# Patient Record
Sex: Female | Born: 1960 | Race: Black or African American | Hispanic: No | Marital: Single | Smoking: Former smoker
Health system: Southern US, Community
[De-identification: ages and names within clinical notes are randomized; demographics above are authoritative.]

## PROBLEM LIST (undated history)

## (undated) DIAGNOSIS — E0590 Thyrotoxicosis, unspecified without thyrotoxic crisis or storm: Secondary | ICD-10-CM

## (undated) DIAGNOSIS — E7800 Pure hypercholesterolemia, unspecified: Secondary | ICD-10-CM

## (undated) DIAGNOSIS — J301 Allergic rhinitis due to pollen: Secondary | ICD-10-CM

## (undated) DIAGNOSIS — M1712 Unilateral primary osteoarthritis, left knee: Secondary | ICD-10-CM

## (undated) DIAGNOSIS — I10 Essential (primary) hypertension: Secondary | ICD-10-CM

## (undated) DIAGNOSIS — E119 Type 2 diabetes mellitus without complications: Secondary | ICD-10-CM

## (undated) DIAGNOSIS — G5600 Carpal tunnel syndrome, unspecified upper limb: Secondary | ICD-10-CM

## (undated) DIAGNOSIS — Z972 Presence of dental prosthetic device (complete) (partial): Secondary | ICD-10-CM

## (undated) HISTORY — PX: HALLUX VALGUS CORRECTION: SUR315

## (undated) HISTORY — PX: TONSILLECTOMY: SUR1361

## (undated) HISTORY — PX: ABDOMINAL HYSTERECTOMY: SHX81

---

## 2004-07-05 ENCOUNTER — Other Ambulatory Visit: Payer: Self-pay

## 2004-07-05 ENCOUNTER — Emergency Department: Payer: Self-pay | Admitting: Unknown Physician Specialty

## 2013-10-25 ENCOUNTER — Ambulatory Visit: Payer: Self-pay | Admitting: Family Medicine

## 2013-11-18 ENCOUNTER — Ambulatory Visit: Payer: Self-pay | Admitting: Gastroenterology

## 2013-11-18 HISTORY — PX: COLONOSCOPY: SHX174

## 2013-12-15 HISTORY — PX: BUNIONECTOMY: SHX129

## 2013-12-16 ENCOUNTER — Ambulatory Visit: Payer: Self-pay | Admitting: Podiatry

## 2014-09-28 ENCOUNTER — Other Ambulatory Visit: Payer: Self-pay | Admitting: Family Medicine

## 2014-09-28 DIAGNOSIS — Z1231 Encounter for screening mammogram for malignant neoplasm of breast: Secondary | ICD-10-CM

## 2014-10-27 ENCOUNTER — Ambulatory Visit
Admission: RE | Admit: 2014-10-27 | Discharge: 2014-10-27 | Disposition: A | Discharge status: Home / Self Care | Payer: BLUE CROSS/BLUE SHIELD | Source: Ambulatory Visit | Attending: Family Medicine | Admitting: Family Medicine

## 2014-10-27 DIAGNOSIS — Z1231 Encounter for screening mammogram for malignant neoplasm of breast: Secondary | ICD-10-CM | POA: Insufficient documentation

## 2014-11-11 ENCOUNTER — Encounter: Payer: Self-pay | Admitting: *Deleted

## 2014-11-17 ENCOUNTER — Ambulatory Visit: Payer: BLUE CROSS/BLUE SHIELD | Admitting: Anesthesiology

## 2014-11-17 ENCOUNTER — Ambulatory Visit
Admission: RE | Admit: 2014-11-17 | Discharge: 2014-11-17 | Disposition: A | Discharge status: Home / Self Care | Payer: BLUE CROSS/BLUE SHIELD | Source: Ambulatory Visit | Attending: Podiatry | Admitting: Podiatry

## 2014-11-17 ENCOUNTER — Encounter
Admission: RE | Disposition: A | Discharge status: Home / Self Care | Payer: Self-pay | Source: Ambulatory Visit | Attending: Podiatry

## 2014-11-17 DIAGNOSIS — E669 Obesity, unspecified: Secondary | ICD-10-CM | POA: Diagnosis not present

## 2014-11-17 DIAGNOSIS — Q6621 Congenital metatarsus primus varus: Secondary | ICD-10-CM | POA: Diagnosis not present

## 2014-11-17 DIAGNOSIS — M2012 Hallux valgus (acquired), left foot: Secondary | ICD-10-CM | POA: Diagnosis not present

## 2014-11-17 DIAGNOSIS — Z79899 Other long term (current) drug therapy: Secondary | ICD-10-CM | POA: Diagnosis not present

## 2014-11-17 DIAGNOSIS — E7800 Pure hypercholesterolemia, unspecified: Secondary | ICD-10-CM | POA: Diagnosis not present

## 2014-11-17 DIAGNOSIS — E119 Type 2 diabetes mellitus without complications: Secondary | ICD-10-CM | POA: Diagnosis not present

## 2014-11-17 DIAGNOSIS — Z7982 Long term (current) use of aspirin: Secondary | ICD-10-CM | POA: Diagnosis not present

## 2014-11-17 DIAGNOSIS — Z888 Allergy status to other drugs, medicaments and biological substances status: Secondary | ICD-10-CM | POA: Diagnosis not present

## 2014-11-17 DIAGNOSIS — F17210 Nicotine dependence, cigarettes, uncomplicated: Secondary | ICD-10-CM | POA: Insufficient documentation

## 2014-11-17 DIAGNOSIS — I10 Essential (primary) hypertension: Secondary | ICD-10-CM | POA: Insufficient documentation

## 2014-11-17 DIAGNOSIS — Z90710 Acquired absence of both cervix and uterus: Secondary | ICD-10-CM | POA: Diagnosis not present

## 2014-11-17 HISTORY — DX: Carpal tunnel syndrome, unspecified upper limb: G56.00

## 2014-11-17 HISTORY — PX: METATARSAL OSTEOTOMY: SHX1641

## 2014-11-17 HISTORY — DX: Pure hypercholesterolemia, unspecified: E78.00

## 2014-11-17 HISTORY — DX: Presence of dental prosthetic device (complete) (partial): Z97.2

## 2014-11-17 HISTORY — DX: Type 2 diabetes mellitus without complications: E11.9

## 2014-11-17 HISTORY — DX: Essential (primary) hypertension: I10

## 2014-11-17 LAB — GLUCOSE, CAPILLARY
Glucose-Capillary: 76 mg/dL (ref 65–99)
Glucose-Capillary: 86 mg/dL (ref 65–99)

## 2014-11-17 SURGERY — OSTEOTOMY, METATARSAL BONE
Anesthesia: General | Laterality: Left | Wound class: Clean

## 2014-11-17 MED ORDER — PROPOFOL 10 MG/ML IV BOLUS
INTRAVENOUS | Status: DC | PRN
  Administered 2014-11-17: 150 mg via INTRAVENOUS

## 2014-11-17 MED ORDER — BUPIVACAINE HCL (PF) 0.5 % IJ SOLN
INTRAMUSCULAR | Status: DC | PRN
  Administered 2014-11-17: 10 mL

## 2014-11-17 MED ORDER — OXYCODONE HCL 5 MG PO TABS: 5.0000 mg | ORAL_TABLET | Freq: Once | ORAL | Status: DC | PRN

## 2014-11-17 MED ORDER — HYDROMORPHONE HCL 1 MG/ML IJ SOLN: 0.2500 mg | INTRAMUSCULAR | Status: DC | PRN

## 2014-11-17 MED ORDER — OXYCODONE HCL 5 MG/5ML PO SOLN: 5.0000 mg | Freq: Once | ORAL | Status: DC | PRN

## 2014-11-17 MED ORDER — DEXAMETHASONE SODIUM PHOSPHATE 10 MG/ML IJ SOLN
INTRAMUSCULAR | Status: DC | PRN
  Administered 2014-11-17: 4 mg

## 2014-11-17 MED ORDER — CEFAZOLIN SODIUM-DEXTROSE 2-3 GM-% IV SOLR: 2.0000 g | Freq: Once | INTRAVENOUS | Status: DC

## 2014-11-17 MED ORDER — ROPIVACAINE HCL 5 MG/ML IJ SOLN
INTRAMUSCULAR | Status: DC | PRN
  Administered 2014-11-17: 200 mg via PERINEURAL

## 2014-11-17 MED ORDER — EPHEDRINE SULFATE 50 MG/ML IJ SOLN
INTRAMUSCULAR | Status: DC | PRN
  Administered 2014-11-17: 10 mg via INTRAVENOUS
  Administered 2014-11-17: 5 mg via INTRAVENOUS

## 2014-11-17 MED ORDER — GLYCOPYRROLATE 0.2 MG/ML IJ SOLN
INTRAMUSCULAR | Status: DC | PRN
  Administered 2014-11-17: 0.1 mg via INTRAVENOUS

## 2014-11-17 MED ORDER — LACTATED RINGERS IV SOLN
INTRAVENOUS | Status: DC
  Administered 2014-11-17: 07:00:00 via INTRAVENOUS

## 2014-11-17 MED ORDER — FENTANYL CITRATE (PF) 100 MCG/2ML IJ SOLN
INTRAMUSCULAR | Status: DC | PRN
  Administered 2014-11-17 (×2): 50 ug via INTRAVENOUS

## 2014-11-17 MED ORDER — PROMETHAZINE HCL 25 MG/ML IJ SOLN: 6.2500 mg | INTRAMUSCULAR | Status: DC | PRN

## 2014-11-17 MED ORDER — DEXAMETHASONE SODIUM PHOSPHATE 4 MG/ML IJ SOLN
INTRAMUSCULAR | Status: DC | PRN
  Administered 2014-11-17: 4 mg via INTRAVENOUS

## 2014-11-17 MED ORDER — MIDAZOLAM HCL 5 MG/5ML IJ SOLN
INTRAMUSCULAR | Status: DC | PRN
  Administered 2014-11-17: 2 mg via INTRAVENOUS

## 2014-11-17 MED ORDER — ONDANSETRON HCL 4 MG/2ML IJ SOLN
INTRAMUSCULAR | Status: DC | PRN
  Administered 2014-11-17: 4 mg via INTRAVENOUS

## 2014-11-17 MED ORDER — MEPERIDINE HCL 25 MG/ML IJ SOLN: 6.2500 mg | INTRAMUSCULAR | Status: DC | PRN

## 2014-11-17 MED ORDER — LIDOCAINE HCL (CARDIAC) 20 MG/ML IV SOLN
INTRAVENOUS | Status: DC | PRN
  Administered 2014-11-17: 50 mg via INTRATRACHEAL

## 2014-11-17 SURGICAL SUPPLY — 42 items
BANDAGE ELASTIC 4 CLIP NS LF (GAUZE/BANDAGES/DRESSINGS) ×3 IMPLANT
BENZOIN TINCTURE PRP APPL 2/3 (GAUZE/BANDAGES/DRESSINGS) ×3 IMPLANT
BLADE OSC/SAGITTAL MD 5.5X18 (BLADE) ×3 IMPLANT
BLADE OSC/SAGITTAL MD 9X18.5 (BLADE) ×3 IMPLANT
BNDG ESMARK 4X12 TAN STRL LF (GAUZE/BANDAGES/DRESSINGS) ×3 IMPLANT
BNDG GAUZE 4.5X4.1 6PLY STRL (MISCELLANEOUS) ×3 IMPLANT
BNDG STRETCH 4X75 STRL LF (GAUZE/BANDAGES/DRESSINGS) ×3 IMPLANT
CANISTER SUCT 1200ML W/VALVE (MISCELLANEOUS) ×3 IMPLANT
CAST PADDING 3X4FT ST 30246 (SOFTGOODS) ×4
CLOSURE WOUND 1/4X4 (GAUZE/BANDAGES/DRESSINGS) ×1
CUFF TOURN SGL QUICK 18 (TOURNIQUET CUFF) ×3 IMPLANT
DRAPE FLUOR MINI C-ARM 54X84 (DRAPES) ×3 IMPLANT
DURAPREP 26ML APPLICATOR (WOUND CARE) ×3 IMPLANT
GAUZE PETRO XEROFOAM 1X8 (MISCELLANEOUS) ×3 IMPLANT
GAUZE SPONGE 4X4 12PLY STRL (GAUZE/BANDAGES/DRESSINGS) ×3 IMPLANT
GLOVE BIO SURGEON STRL SZ7.5 (GLOVE) ×6 IMPLANT
GLOVE BIO SURGEON STRL SZ8 (GLOVE) ×3 IMPLANT
GLOVE INDICATOR 8.0 STRL GRN (GLOVE) ×6 IMPLANT
GOWN STRL REUS W/ TWL XL LVL3 (GOWN DISPOSABLE) ×2 IMPLANT
GOWN STRL REUS W/TWL XL LVL3 (GOWN DISPOSABLE) ×4
K-WIRE DBL END TROCAR 6X.045 (WIRE) ×3
K-WIRE DBL END TROCAR 6X.062 (WIRE) ×3
KWIRE DBL END TROCAR 6X.045 (WIRE) ×1 IMPLANT
KWIRE DBL END TROCAR 6X.062 (WIRE) ×1 IMPLANT
MICROAIRE 0.062 KWIRE ×3 IMPLANT
NEEDLE HYPO 18GX1.5 BLUNT FILL (NEEDLE) IMPLANT
NEEDLE HYPO 25GX1X1/2 BEV (NEEDLE) IMPLANT
PACK EXTREMITY ARMC (MISCELLANEOUS) ×3 IMPLANT
PAD CAST CTTN 3X4 STRL (SOFTGOODS) ×2 IMPLANT
PAD GROUND ADULT SPLIT (MISCELLANEOUS) ×3 IMPLANT
RASP SM TEAR CROSS CUT (RASP) ×6 IMPLANT
SPLINT CAST 1 STEP 4X30 (MISCELLANEOUS) ×3 IMPLANT
SPLINT FAST PLASTER 5X30 (CAST SUPPLIES) ×2
SPLINT PLASTER CAST FAST 5X30 (CAST SUPPLIES) ×1 IMPLANT
STAPLE SPR MET 9X9X9 1.5 (Staple) ×3 IMPLANT
STOCKINETTE STRL 6IN 960660 (GAUZE/BANDAGES/DRESSINGS) ×3 IMPLANT
STRIP CLOSURE SKIN 1/4X4 (GAUZE/BANDAGES/DRESSINGS) ×2 IMPLANT
SUT VIC AB 3-0 SH 27 (SUTURE) ×2
SUT VIC AB 3-0 SH 27X BRD (SUTURE) ×1 IMPLANT
SUT VIC AB 4-0 FS2 27 (SUTURE) ×3 IMPLANT
SYRINGE 10CC LL (SYRINGE) IMPLANT
WATER STERILE IRR 1000ML POUR (IV SOLUTION) ×3 IMPLANT

## 2014-11-17 NOTE — Discharge Instructions (Signed)
[facility]  POST OPERATIVE INSTRUCTIONS FOR DR. TROXLER AND DR[facility] PODIATRY DEPARTMENT   1. Take your medication as prescribed.  Pain medication should be taken only as needed.  2. Keep the dressing clean, dry and intact.  3. Keep your foot elevated above the heart level for the first 48 hours.  4. Walking to the bathroom and brief periods of walking are acceptable, unless we have instructed you to be non-weight bearing.  5. Always wear your post-op shoe when walking.  Always use your crutches if you are to be non-weight bearing.   6. Do not take a shower. Baths are permissible as long as the foot is kept out of the water.   7. Every hour you are awake:  - Bend your knee 15 times. - Flex foot 15 times - Massage calf 15 times  8. Call [facility] [phone]) if any of the following problems occur: - You develop a temperature or fever. - The bandage becomes saturated with blood. - Medication does not stop your pain. - Injury of the foot occurs. - Any symptoms of infection including redness, odor, or red streaks running from wound.  General Anesthesia, Adult, Care After Refer to this sheet in the next few weeks. These instructions provide you with information on caring for yourself after your procedure. Your health care provider may also give you more specific instructions. Your treatment has been planned according to current medical practices, but problems sometimes occur. Call your health care provider if you have any problems or questions after your procedure. WHAT TO EXPECT AFTER THE PROCEDURE After the procedure, it is typical to experience:  Sleepiness.  Nausea and vomiting. HOME CARE INSTRUCTIONS  For the first 24 hours after general anesthesia:  Have a responsible person with you.  Do not drive a car. If you are alone, do not take public transportation.  Do not drink alcohol.  Do not take  medicine that has not been prescribed by your health care provider.  Do not sign important papers or make important decisions.  You may resume a normal diet and activities as directed by your health care provider.  Change bandages (dressings) as directed.  If you have questions or problems that seem related to general anesthesia, call the hospital and ask for the anesthetist or anesthesiologist on call. SEEK MEDICAL CARE IF:  You have nausea and vomiting that continue the day after anesthesia.  You develop a rash. SEEK IMMEDIATE MEDICAL CARE IF:   You have difficulty breathing.  You have chest pain.  You have any allergic problems.   This information is not intended to replace advice given to you by your health care provider. Make sure you discuss any questions you have with your health care provider.   Document Released: 04/08/2000 Document Revised: 01/21/2014 Document Reviewed: 05/01/2011 Elsevier Interactive Patient Education 2016 Else[employer].  Peripheral Nerve Blocks Your caregiver has placed a nerve block in one of your arms or legs to reduce pain and discomfort. The block lessens the amount of pain medicine you will need. Your caregiver will inject you in the arm or leg that was operated on. The injection is usually given away from the surgical site. The injection is a local anesthetic or a combination of local anesthetics. This injection provides numbing pain relief for up to 18 to 24 hours. There are few possible complications from this procedure. However, you should notify your caregiver if you have any problems.  Be aware that you may lose feeling at and around the surgical area. If numbness happens, take proper measures to avoid injury. Do not stand up unassisted if you have a nerve block in your leg. Do not try to lift items if you have had a nerve block in your arm. Be careful when placing hot or cold items on a numb extremity. SEEK IMMEDIATE MEDICAL CARE IF:  You have  redness, swelling, pain, or discharge at the injection site.  You develop dizziness or lightheadedness.  You have blurred vision.  There is a ringing or buzzing in your ears.  You have a metal taste in your mouth.  You develop numbness or tingling around your mouth.  You develop drowsiness.  You develop confusion.   This information is not intended to replace advice given to you by your health care provider. Make sure you discuss any questions you have with your health care provider.   Document Released: 04/09/2007 Document Revised: 03/25/2011 Document Reviewed: 02/19/2010 Elsevier Interactive Patient Education [employer] Elsevier Inc.

## 2014-11-17 NOTE — Transfer of Care (Signed)
Immediate Anesthesia Transfer of Care Note  Patient: Shelly SinghSarah J Rossel  Procedure(s) Performed: Procedure(s) with comments: METATARSAL OSTEOTOMY LEFT GREAT TOE 1ST METATARSAL (Left) - LMA WITH LOCAL Diabetic - oral meds  Patient Location: PACU  Anesthesia Type: General  Level of Consciousness: awake, alert  and patient cooperative  Airway and Oxygen Therapy: Patient Spontanous Breathing and Patient connected to supplemental oxygen  Post-op Assessment: Post-op Vital signs reviewed, Patient's Cardiovascular Status Stable, Respiratory Function Stable, Patent Airway and No signs of Nausea or vomiting  Post-op Vital Signs: Reviewed and stable  Complications: No apparent anesthesia complications

## 2014-11-17 NOTE — H&P (Signed)
H and P has been reviewed and no changes are noted.  

## 2014-11-17 NOTE — Anesthesia Procedure Notes (Addendum)
Procedure Name: LMA Insertion Date/Time: 11/17/2014 7:44 AM Performed by: Jimmy PicketAMYOT, MICHAEL Pre-anesthesia Checklist: Patient identified, Emergency Drugs available, Suction available, Timeout performed and Patient being monitored Patient Re-evaluated:Patient Re-evaluated prior to inductionOxygen Delivery Method: Circle system utilized Preoxygenation: Pre-oxygenation with 100% oxygen Intubation Type: IV induction LMA: LMA inserted LMA Size: 4.0 Number of attempts: 1 Placement Confirmation: positive ETCO2 and breath sounds checked- equal and bilateral Tube secured with: Tape   Anesthesia Regional Block:  Popliteal block  Pre-Anesthetic Checklist: ,, timeout performed, Correct Patient, Correct Site, Correct Laterality, Correct Procedure, Correct Position, risks and benefits discussed,, pre-op evaluation, at surgeon's request and post-op pain management  Laterality: Left  Prep: chloraprep       Needles:  Injection technique: Single-shot     Needle Length: 4cm 4 cm Needle Gauge: 21 and 21 G    Additional Needles:  Procedures: ultrasound guided (picture in chart) Popliteal block Narrative:  Injection made incrementally with aspirations every 5 mL.  Performed by: Personally  Anesthesiologist: Harolyn RutherfordFRIEDMAN, Tranice Laduke

## 2014-11-17 NOTE — Anesthesia Postprocedure Evaluation (Signed)
  Anesthesia Post-op Note  Patient: Shelly SinghSarah J Lee  Procedure(s) Performed: Procedure(s) with comments: METATARSAL OSTEOTOMY LEFT GREAT TOE 1ST METATARSAL (Left) - LMA WITH LOCAL Diabetic - oral meds  Anesthesia type:General  Patient location: PACU  Post pain: Pain level controlled  Post assessment: Post-op Vital signs reviewed, Patient's Cardiovascular Status Stable, Respiratory Function Stable, Patent Airway and No signs of Nausea or vomiting  Post vital signs: Reviewed and stable  Last Vitals:  Filed Vitals:   11/17/14 0935  BP: 129/84  Pulse: 75  Temp:   Resp: 15    Level of consciousness: awake, alert  and patient cooperative  Complications: No apparent anesthesia complications

## 2014-11-17 NOTE — Progress Notes (Signed)
Assisted Dr. Zachery ConchFriedman with left, popliteal block. Side rails up, monitors on throughout procedure. See vital signs in flow sheet. Tolerated Procedure well. In PACU per Surgeon request.

## 2014-11-17 NOTE — Anesthesia Preprocedure Evaluation (Signed)
Anesthesia Evaluation  Patient identified by MRN, date of birth, ID band Patient awake    Reviewed: Allergy & Precautions, NPO status , Patient's Chart, lab work & pertinent test results, reviewed documented beta blocker date and time   Airway Mallampati: II  TM Distance: >3 FB Neck ROM: Full    Dental  (+) Partial Upper   Pulmonary Current Smoker,    Pulmonary exam normal        Cardiovascular hypertension, Normal cardiovascular exam     Neuro/Psych negative neurological ROS  negative psych ROS   GI/Hepatic negative GI ROS, Neg liver ROS,   Endo/Other  diabetes, Type 2, Oral Hypoglycemic Agents  Renal/GU negative Renal ROS  negative genitourinary   Musculoskeletal negative musculoskeletal ROS (+)   Abdominal   Peds negative pediatric ROS (+)  Hematology negative hematology ROS (+)   Anesthesia Other Findings   Reproductive/Obstetrics negative OB ROS                             Anesthesia Physical Anesthesia Plan  ASA: II  Anesthesia Plan: General   Post-op Pain Management:    Induction: Intravenous  Airway Management Planned: LMA  Additional Equipment:   Intra-op Plan:   Post-operative Plan:   Informed Consent: I have reviewed the patients History and Physical, chart, labs and discussed the procedure including the risks, benefits and alternatives for the proposed anesthesia with the patient or authorized representative who has indicated his/her understanding and acceptance.     Plan Discussed with: CRNA  Anesthesia Plan Comments:         Anesthesia Quick Evaluation

## 2014-11-18 ENCOUNTER — Encounter: Payer: Self-pay | Admitting: Podiatry

## 2014-11-18 NOTE — Op Note (Signed)
Operative note   Surgeon: Dr. Recardo EvangelistMatthew Phoenicia Pirie, DPM.    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 Hallux valgus correction with Austin osteotomy and osteotomy of the proximal phalanx as well with screw fixation and staple fixation respectively          EBL: Less than 10 cc    Anesthesia:general popliteal block    Hemostasis: Ankle tourniquet 250 mmHg pressure    Specimen: None    Complications: None    Operative indications: Chronic pain with poor response to any conservative care    Procedure:  Patient was brought into the OR and placed on the operating table in thesupine position. After anesthesia was obtained theleft lower extremity was prepped and draped in usual sterile fashion.  Operative Report: This time to his directed to the dorsum of the first metatarsophalangeal joint of the left foot a 5 cm dorsolinear skin incision was made and deepened sharp and blunt dissection. TISSUE was identified and incised longitudinally and reflected away from dorsal medial plantar aspects of the first metatarsal head. At this time a large medial dorsal medial eminence of bone were noted to the first metatarsal head. These resected and rasped smoothly. At this time a 0.05 K wire was used for an apical axis CAD to make an osteotomy cut to the first metatarsal. The osteotomy was in a V fashion apex distal base proximal at the proximal head portion of the metatarsal. Once this was complete the K wire was removed attention was directed to the lateral aspect the joint were Nidek tendon release and lateral capsulotomy were performed. The head of metatarsals and transposed to a more lateral and plantar position and fixated with a 0.062 K wire there is checked FluoroScan good position correction were noted. The K wires and then Rotated up against the bone. Medial shelf was then resected and rasped smoothly.  Ointment was noted there was still some abduction of the hallux and an osteotomy was performed in the proximal phalanx in a wedge fashion with the apex lateral base medial this wedge of bone was removed and feathered and closed and fixated with a bone staple this was checked FluoroScan and seen to be stable with good fixation and good correction. After copious irrigation the capsule tissue was enclosed with 4 Vicryl in a continuous stitch. One 3-0 Vicryl was used at the joint level to bring the extensor tendon overall but more. This was to a more medial dorsal position. Deep superficial fascial layers and closed with 4 Vicryl in continuous stitch. Skin closed with 4 Vicryl subcuticular stitch.  Sterile compressive dressing was placed across wound consisting of Steri-Strips Xeroform gauze 4 x 4's Kling and Kerlix.    Patient tolerated the procedure and anesthesia well.  Was transported from the OR to the PACU with all vital signs stable and vascular status intact. To be discharged per routine protocol.  Will follow up in approximately 1 week in the outpatient clinic.

## 2015-11-17 ENCOUNTER — Other Ambulatory Visit: Payer: Self-pay | Admitting: Family Medicine

## 2015-11-17 DIAGNOSIS — Z1231 Encounter for screening mammogram for malignant neoplasm of breast: Secondary | ICD-10-CM

## 2015-12-22 ENCOUNTER — Ambulatory Visit
Admission: RE | Admit: 2015-12-22 | Discharge: 2015-12-22 | Disposition: A | Discharge status: Home / Self Care | Payer: BLUE CROSS/BLUE SHIELD | Source: Ambulatory Visit | Attending: Family Medicine | Admitting: Family Medicine

## 2015-12-22 DIAGNOSIS — Z1231 Encounter for screening mammogram for malignant neoplasm of breast: Secondary | ICD-10-CM | POA: Insufficient documentation

## 2016-12-19 ENCOUNTER — Other Ambulatory Visit: Payer: Self-pay | Admitting: Family Medicine

## 2016-12-19 DIAGNOSIS — Z1231 Encounter for screening mammogram for malignant neoplasm of breast: Secondary | ICD-10-CM

## 2016-12-24 ENCOUNTER — Ambulatory Visit
Admission: RE | Admit: 2016-12-24 | Discharge: 2016-12-24 | Disposition: A | Discharge status: Home / Self Care | Payer: BLUE CROSS/BLUE SHIELD | Source: Ambulatory Visit | Attending: Family Medicine | Admitting: Family Medicine

## 2016-12-24 DIAGNOSIS — Z1231 Encounter for screening mammogram for malignant neoplasm of breast: Secondary | ICD-10-CM | POA: Diagnosis present

## 2018-01-23 ENCOUNTER — Other Ambulatory Visit: Payer: Self-pay | Admitting: Family Medicine

## 2018-01-23 DIAGNOSIS — Z1231 Encounter for screening mammogram for malignant neoplasm of breast: Secondary | ICD-10-CM

## 2018-02-12 ENCOUNTER — Ambulatory Visit
Admission: RE | Admit: 2018-02-12 | Discharge: 2018-02-12 | Disposition: A | Discharge status: Home / Self Care | Payer: BLUE CROSS/BLUE SHIELD | Source: Ambulatory Visit | Attending: Family Medicine | Admitting: Family Medicine

## 2018-02-12 DIAGNOSIS — Z1231 Encounter for screening mammogram for malignant neoplasm of breast: Secondary | ICD-10-CM | POA: Insufficient documentation

## 2018-07-23 ENCOUNTER — Other Ambulatory Visit: Payer: Self-pay | Admitting: Orthopedic Surgery

## 2018-07-23 DIAGNOSIS — M25362 Other instability, left knee: Secondary | ICD-10-CM

## 2018-07-23 DIAGNOSIS — M1712 Unilateral primary osteoarthritis, left knee: Secondary | ICD-10-CM

## 2018-07-23 DIAGNOSIS — M2392 Unspecified internal derangement of left knee: Secondary | ICD-10-CM

## 2018-07-23 DIAGNOSIS — M2352 Chronic instability of knee, left knee: Secondary | ICD-10-CM

## 2018-07-29 ENCOUNTER — Other Ambulatory Visit: Payer: Self-pay

## 2018-07-29 ENCOUNTER — Ambulatory Visit
Admission: RE | Admit: 2018-07-29 | Discharge: 2018-07-29 | Disposition: A | Discharge status: Home / Self Care | Payer: BC Managed Care – PPO | Source: Ambulatory Visit | Attending: Orthopedic Surgery | Admitting: Orthopedic Surgery

## 2018-07-29 DIAGNOSIS — M1712 Unilateral primary osteoarthritis, left knee: Secondary | ICD-10-CM | POA: Diagnosis present

## 2018-07-29 DIAGNOSIS — M2392 Unspecified internal derangement of left knee: Secondary | ICD-10-CM | POA: Diagnosis present

## 2018-07-29 DIAGNOSIS — M25362 Other instability, left knee: Secondary | ICD-10-CM | POA: Diagnosis not present

## 2018-07-29 DIAGNOSIS — M2352 Chronic instability of knee, left knee: Secondary | ICD-10-CM | POA: Insufficient documentation

## 2019-01-06 ENCOUNTER — Other Ambulatory Visit: Payer: Self-pay | Admitting: Family Medicine

## 2019-01-06 DIAGNOSIS — Z1231 Encounter for screening mammogram for malignant neoplasm of breast: Secondary | ICD-10-CM

## 2019-02-15 ENCOUNTER — Ambulatory Visit
Admission: RE | Admit: 2019-02-15 | Discharge: 2019-02-15 | Disposition: A | Discharge status: Home / Self Care | Payer: BC Managed Care – PPO | Source: Ambulatory Visit | Attending: Family Medicine | Admitting: Family Medicine

## 2019-02-15 DIAGNOSIS — Z1231 Encounter for screening mammogram for malignant neoplasm of breast: Secondary | ICD-10-CM | POA: Diagnosis present

## 2019-02-18 ENCOUNTER — Other Ambulatory Visit: Payer: Self-pay | Admitting: Family Medicine

## 2019-02-18 DIAGNOSIS — R928 Other abnormal and inconclusive findings on diagnostic imaging of breast: Secondary | ICD-10-CM

## 2019-02-18 DIAGNOSIS — R921 Mammographic calcification found on diagnostic imaging of breast: Secondary | ICD-10-CM

## 2019-02-24 ENCOUNTER — Ambulatory Visit
Admission: RE | Admit: 2019-02-24 | Discharge: 2019-02-24 | Disposition: A | Discharge status: Home / Self Care | Payer: BC Managed Care – PPO | Source: Ambulatory Visit | Attending: Family Medicine | Admitting: Family Medicine

## 2019-02-24 DIAGNOSIS — R921 Mammographic calcification found on diagnostic imaging of breast: Secondary | ICD-10-CM

## 2019-02-24 DIAGNOSIS — R928 Other abnormal and inconclusive findings on diagnostic imaging of breast: Secondary | ICD-10-CM

## 2019-03-01 ENCOUNTER — Other Ambulatory Visit: Payer: Self-pay | Admitting: Family Medicine

## 2019-03-01 DIAGNOSIS — R928 Other abnormal and inconclusive findings on diagnostic imaging of breast: Secondary | ICD-10-CM

## 2019-03-01 DIAGNOSIS — R921 Mammographic calcification found on diagnostic imaging of breast: Secondary | ICD-10-CM

## 2019-03-04 ENCOUNTER — Ambulatory Visit: Admission: RE | Admit: 2019-03-04 | Payer: BC Managed Care – PPO | Source: Ambulatory Visit

## 2019-03-04 ENCOUNTER — Ambulatory Visit: Payer: BC Managed Care – PPO

## 2019-03-09 ENCOUNTER — Ambulatory Visit
Admission: RE | Admit: 2019-03-09 | Discharge: 2019-03-09 | Disposition: A | Discharge status: Home / Self Care | Payer: BC Managed Care – PPO | Source: Ambulatory Visit | Attending: Family Medicine | Admitting: Family Medicine

## 2019-03-09 DIAGNOSIS — R921 Mammographic calcification found on diagnostic imaging of breast: Secondary | ICD-10-CM | POA: Diagnosis present

## 2019-03-09 DIAGNOSIS — R928 Other abnormal and inconclusive findings on diagnostic imaging of breast: Secondary | ICD-10-CM | POA: Diagnosis not present

## 2019-03-09 HISTORY — PX: BREAST BIOPSY: SHX20

## 2019-03-10 LAB — SURGICAL PATHOLOGY

## 2020-02-18 ENCOUNTER — Other Ambulatory Visit: Payer: Self-pay | Admitting: Family Medicine

## 2020-02-18 DIAGNOSIS — Z1231 Encounter for screening mammogram for malignant neoplasm of breast: Secondary | ICD-10-CM

## 2020-03-10 ENCOUNTER — Ambulatory Visit
Admission: RE | Admit: 2020-03-10 | Discharge: 2020-03-10 | Disposition: A | Discharge status: Home / Self Care | Payer: BC Managed Care – PPO | Source: Ambulatory Visit | Attending: Family Medicine | Admitting: Family Medicine

## 2020-03-10 ENCOUNTER — Other Ambulatory Visit: Payer: Self-pay

## 2020-03-10 DIAGNOSIS — Z1231 Encounter for screening mammogram for malignant neoplasm of breast: Secondary | ICD-10-CM | POA: Insufficient documentation

## 2021-02-01 ENCOUNTER — Other Ambulatory Visit: Payer: Self-pay | Admitting: Family Medicine

## 2021-02-01 DIAGNOSIS — Z1231 Encounter for screening mammogram for malignant neoplasm of breast: Secondary | ICD-10-CM

## 2021-03-12 ENCOUNTER — Other Ambulatory Visit: Payer: Self-pay

## 2021-03-12 ENCOUNTER — Ambulatory Visit
Admission: RE | Admit: 2021-03-12 | Discharge: 2021-03-12 | Disposition: A | Discharge status: Home / Self Care | Payer: BC Managed Care – PPO | Source: Ambulatory Visit | Attending: Family Medicine | Admitting: Family Medicine

## 2021-03-12 DIAGNOSIS — Z1231 Encounter for screening mammogram for malignant neoplasm of breast: Secondary | ICD-10-CM | POA: Diagnosis not present

## 2022-02-07 ENCOUNTER — Other Ambulatory Visit: Payer: Self-pay | Admitting: Family Medicine

## 2022-02-07 DIAGNOSIS — Z1231 Encounter for screening mammogram for malignant neoplasm of breast: Secondary | ICD-10-CM

## 2022-03-13 ENCOUNTER — Ambulatory Visit
Admission: RE | Admit: 2022-03-13 | Discharge: 2022-03-13 | Disposition: A | Discharge status: Home / Self Care | Payer: BC Managed Care – PPO | Source: Ambulatory Visit | Attending: Family Medicine | Admitting: Family Medicine

## 2022-03-13 DIAGNOSIS — Z1231 Encounter for screening mammogram for malignant neoplasm of breast: Secondary | ICD-10-CM | POA: Insufficient documentation

## 2022-05-31 IMAGING — MG MM DIGITAL SCREENING BILAT W/ TOMO AND CAD
8 series · 8 of 24 positions shown · non-contrast
Comparison: Previous exam(s).

CLINICAL DATA: Screening.

EXAM:
DIGITAL SCREENING BILATERAL MAMMOGRAM WITH TOMOSYNTHESIS AND CAD
TECHNIQUE: Bilateral screening digital craniocaudal and mediolateral oblique
mammograms were obtained. Bilateral screening digital breast
tomosynthesis was performed. The images were evaluated with
computer-aided detection.

[R CC synth-2D]
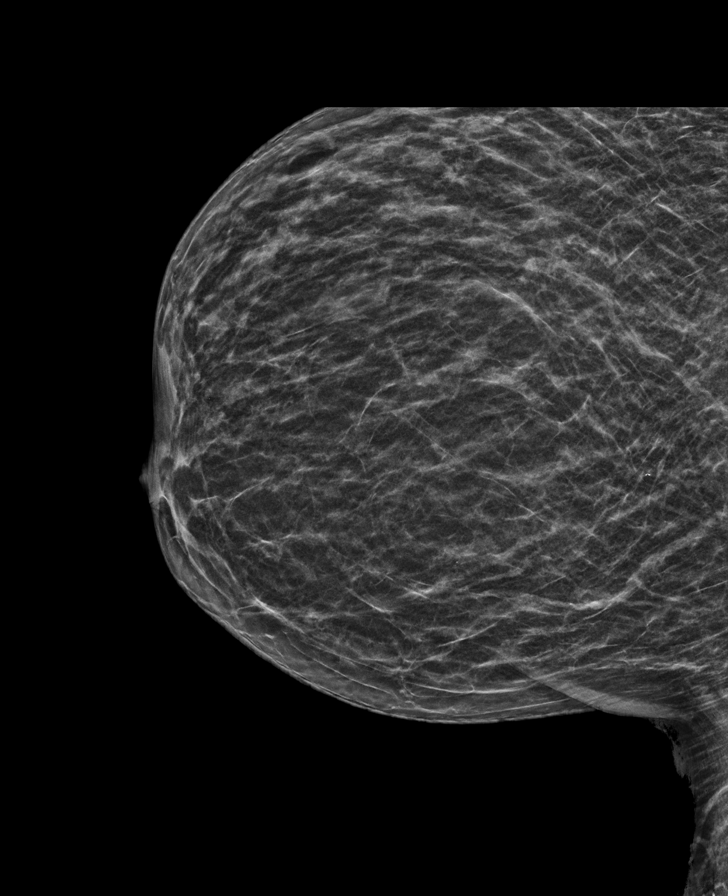

[R MLO synth-2D]
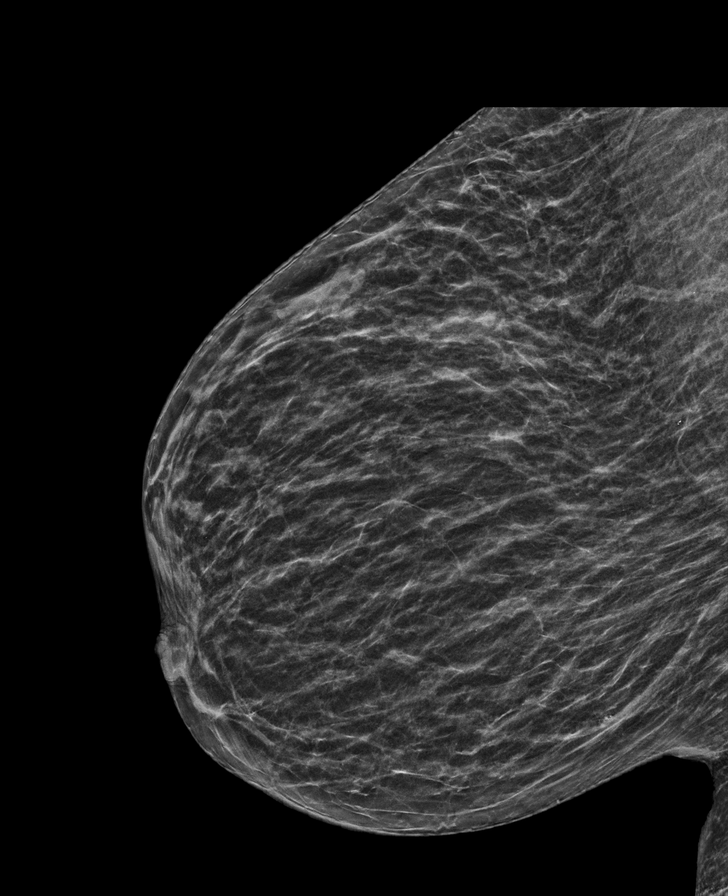

[L CC synth-2D]
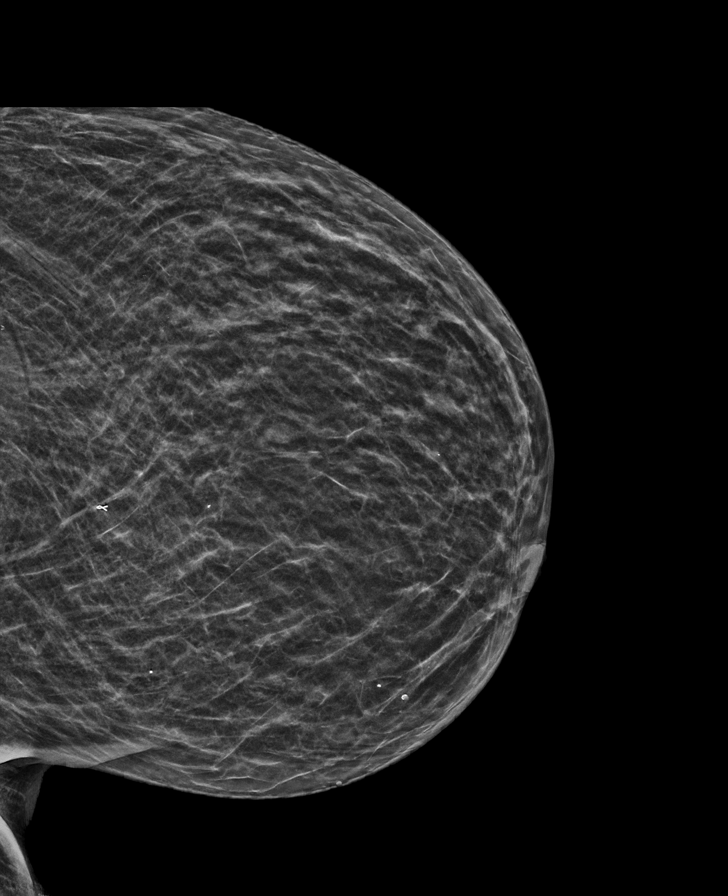

[L MLO synth-2D]
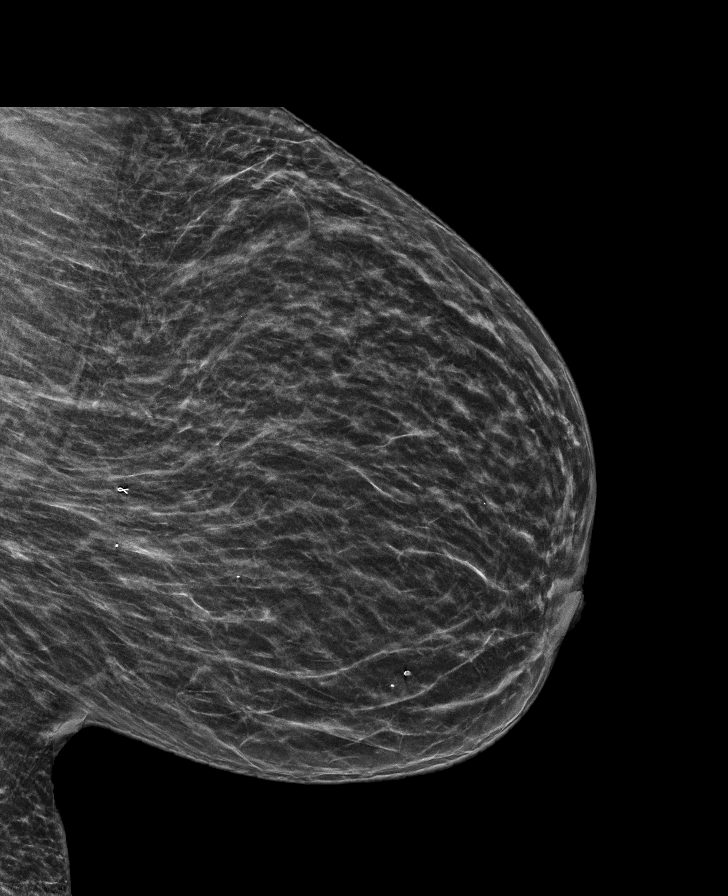

[R CC tomo · tomo slice 24/47.0]
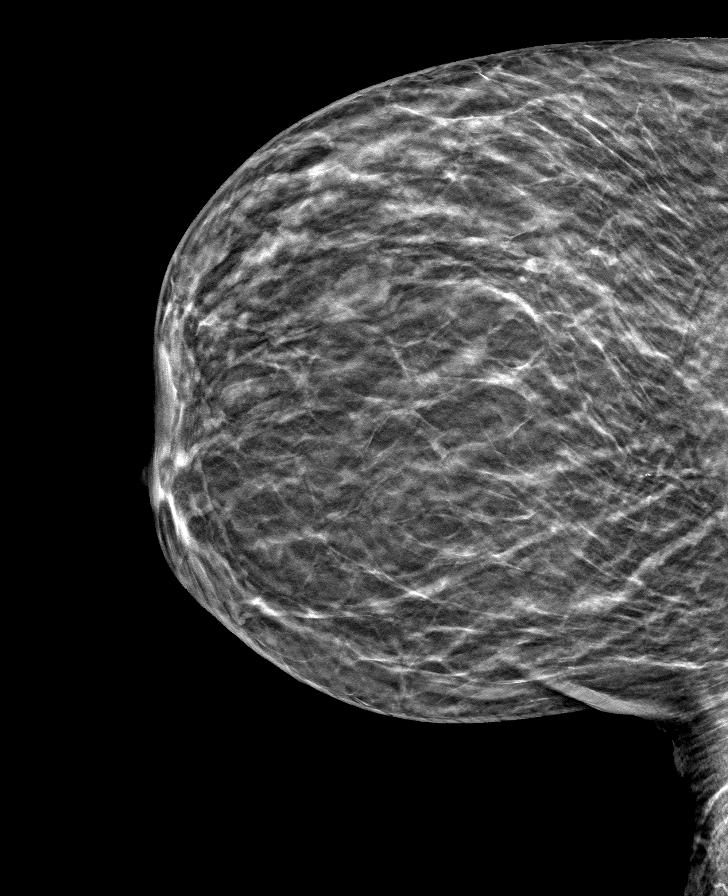

[L CC tomo · tomo slice 23/46.0]
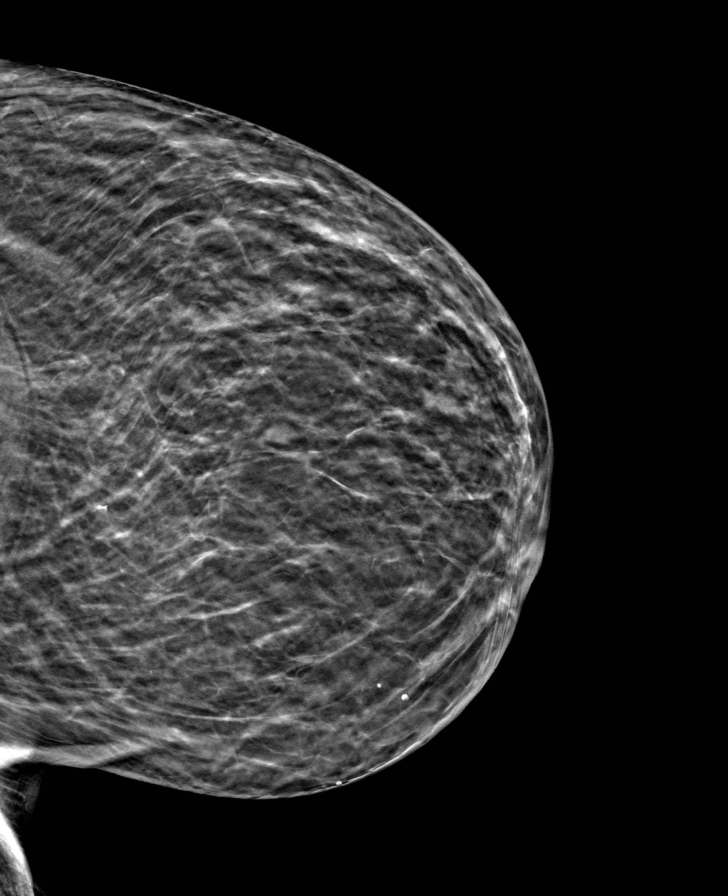

[L MLO tomo · tomo slice 25/48.0]
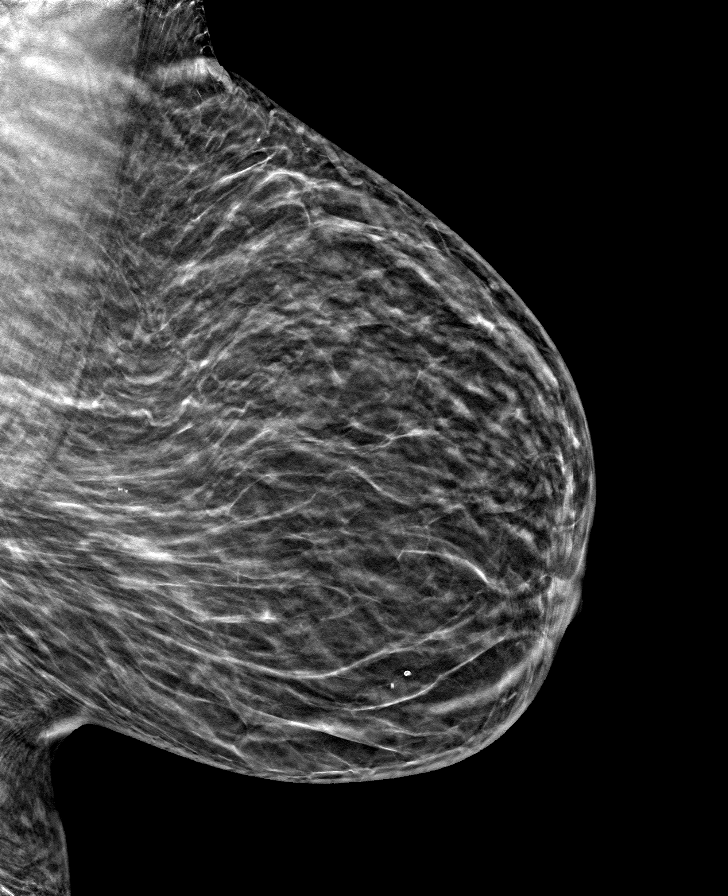

[R MLO tomo · tomo slice 25/48.0]
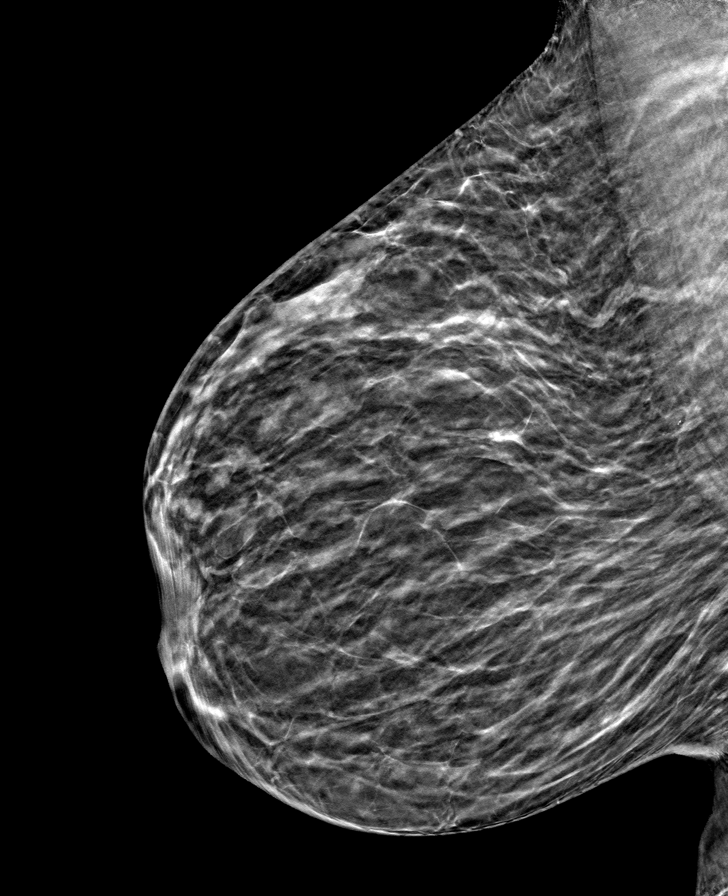

[8 of 24 positions shown; findings below may reference images not displayed]

ACR Breast Density Category b: There are scattered areas of
fibroglandular density.
FINDINGS: There are no findings suspicious for malignancy.
IMPRESSION: No mammographic evidence of malignancy. A result letter of this
screening mammogram will be mailed directly to the patient.

RECOMMENDATION:
Screening mammogram in one year. (Code:51-O-LD2)

BI-RADS CATEGORY  1: Negative.

## 2023-02-21 ENCOUNTER — Other Ambulatory Visit: Payer: Self-pay | Admitting: Family Medicine

## 2023-02-21 DIAGNOSIS — Z1231 Encounter for screening mammogram for malignant neoplasm of breast: Secondary | ICD-10-CM

## 2023-03-19 ENCOUNTER — Ambulatory Visit
Admission: RE | Admit: 2023-03-19 | Discharge: 2023-03-19 | Disposition: A | Discharge status: Home / Self Care | Payer: BC Managed Care – PPO | Source: Ambulatory Visit | Attending: Family Medicine | Admitting: Family Medicine

## 2023-03-19 DIAGNOSIS — Z1231 Encounter for screening mammogram for malignant neoplasm of breast: Secondary | ICD-10-CM | POA: Insufficient documentation

## 2023-11-18 ENCOUNTER — Encounter: Payer: Self-pay | Admitting: *Deleted

## 2023-11-28 ENCOUNTER — Ambulatory Visit
Admission: RE | Admit: 2023-11-28 | Discharge: 2023-11-28 | Disposition: A | Discharge status: Home / Self Care | Attending: Gastroenterology | Admitting: Gastroenterology

## 2023-11-28 ENCOUNTER — Ambulatory Visit: Admitting: Anesthesiology

## 2023-11-28 ENCOUNTER — Encounter: Admission: RE | Disposition: A | Payer: Self-pay | Source: Home / Self Care | Attending: Gastroenterology

## 2023-11-28 ENCOUNTER — Other Ambulatory Visit: Payer: Self-pay

## 2023-11-28 DIAGNOSIS — E119 Type 2 diabetes mellitus without complications: Secondary | ICD-10-CM | POA: Diagnosis not present

## 2023-11-28 DIAGNOSIS — Z83719 Family history of colon polyps, unspecified: Secondary | ICD-10-CM | POA: Diagnosis not present

## 2023-11-28 DIAGNOSIS — I10 Essential (primary) hypertension: Secondary | ICD-10-CM | POA: Diagnosis not present

## 2023-11-28 DIAGNOSIS — Z1211 Encounter for screening for malignant neoplasm of colon: Secondary | ICD-10-CM | POA: Diagnosis present

## 2023-11-28 DIAGNOSIS — Z90710 Acquired absence of both cervix and uterus: Secondary | ICD-10-CM | POA: Insufficient documentation

## 2023-11-28 DIAGNOSIS — Z7985 Long-term (current) use of injectable non-insulin antidiabetic drugs: Secondary | ICD-10-CM | POA: Diagnosis not present

## 2023-11-28 DIAGNOSIS — Z7984 Long term (current) use of oral hypoglycemic drugs: Secondary | ICD-10-CM | POA: Diagnosis not present

## 2023-11-28 DIAGNOSIS — K640 First degree hemorrhoids: Secondary | ICD-10-CM | POA: Insufficient documentation

## 2023-11-28 HISTORY — DX: Allergic rhinitis due to pollen: J30.1

## 2023-11-28 HISTORY — DX: Thyrotoxicosis, unspecified without thyrotoxic crisis or storm: E05.90

## 2023-11-28 HISTORY — PX: COLONOSCOPY: SHX5424

## 2023-11-28 HISTORY — DX: Unilateral primary osteoarthritis, left knee: M17.12

## 2023-11-28 LAB — GLUCOSE, CAPILLARY: Glucose-Capillary: 90 mg/dL (ref 70–99)

## 2023-11-28 SURGERY — COLONOSCOPY
Anesthesia: General

## 2023-11-28 MED ORDER — SODIUM CHLORIDE 0.9 % IV SOLN: INTRAVENOUS | Status: DC

## 2023-11-28 MED ORDER — PROPOFOL 500 MG/50ML IV EMUL
INTRAVENOUS | Status: DC | PRN
  Administered 2023-11-28: 75 ug/kg/min via INTRAVENOUS

## 2023-11-28 MED ORDER — PROPOFOL 10 MG/ML IV BOLUS
INTRAVENOUS | Status: DC | PRN
  Administered 2023-11-28 (×2): 50 mg via INTRAVENOUS

## 2023-11-28 MED ORDER — DEXMEDETOMIDINE HCL IN NACL 80 MCG/20ML IV SOLN
INTRAVENOUS | Status: AC
  Filled 2023-11-28: qty 20

## 2023-11-28 MED ORDER — GLYCOPYRROLATE 0.2 MG/ML IJ SOLN
INTRAMUSCULAR | Status: AC
  Filled 2023-11-28: qty 2

## 2023-11-28 MED ORDER — LIDOCAINE HCL (CARDIAC) PF 100 MG/5ML IV SOSY
PREFILLED_SYRINGE | INTRAVENOUS | Status: DC | PRN
  Administered 2023-11-28: 80 mg via INTRAVENOUS

## 2023-11-28 MED ORDER — DEXMEDETOMIDINE HCL IN NACL 80 MCG/20ML IV SOLN
INTRAVENOUS | Status: DC | PRN
  Administered 2023-11-28: 8 ug via INTRAVENOUS
  Administered 2023-11-28: 12 ug via INTRAVENOUS

## 2023-11-28 MED ORDER — LIDOCAINE HCL (PF) 2 % IJ SOLN
INTRAMUSCULAR | Status: AC
  Filled 2023-11-28: qty 10

## 2023-11-28 NOTE — Anesthesia Postprocedure Evaluation (Signed)
 Anesthesia Post Note  Patient: Shelly Lee  Procedure(s) Performed: COLONOSCOPY  Patient location during evaluation: PACU Anesthesia Type: General Level of consciousness: awake and alert Pain management: pain level controlled Vital Signs Assessment: post-procedure vital signs reviewed and stable Respiratory status: spontaneous breathing, nonlabored ventilation and respiratory function stable Cardiovascular status: blood pressure returned to baseline and stable Postop Assessment: no apparent nausea or vomiting Anesthetic complications: no   No notable events documented.   Last Vitals:  Vitals:   11/28/23 1010 11/28/23 1020  BP: 97/61 (!) 101/57  Pulse: 73 82  Resp: 14 17  Temp:    SpO2: 98% 99%    Last Pain:  Vitals:   11/28/23 1020  TempSrc:   PainSc: 0-No pain                 Camellia Merilee Louder

## 2023-11-28 NOTE — Anesthesia Preprocedure Evaluation (Signed)
 Anesthesia Evaluation  Patient identified by MRN, date of birth, ID band Patient awake    Reviewed: Allergy & Precautions, H&P , NPO status , Patient's Chart, lab work & pertinent test results  Airway Mallampati: II  TM Distance: >3 FB Neck ROM: full    Dental  (+) Chipped, Missing   Pulmonary neg pulmonary ROS, former smoker   Pulmonary exam normal        Cardiovascular hypertension, Normal cardiovascular exam     Neuro/Psych negative neurological ROS  negative psych ROS   GI/Hepatic negative GI ROS, Neg liver ROS,,,  Endo/Other  diabetes, Type 2 Hyperthyroidism   Renal/GU negative Renal ROS  negative genitourinary   Musculoskeletal   Abdominal Normal abdominal exam  (+)   Peds  Hematology negative hematology ROS (+)   Anesthesia Other Findings Past Medical History: No date: Carpal tunnel syndrome No date: Diabetes mellitus without complication (HCC) No date: Hypercholesteremia No date: Hypertension No date: Primary osteoarthritis of left knee No date: Seasonal allergic rhinitis due to pollen No date: Subclinical hyperthyroidism No date: Wears dentures     Comment:  partial upper  Past Surgical History: No date: ABDOMINAL HYSTERECTOMY 03/09/2019: BREAST BIOPSY; Left     Comment:  Affirm Bx- Ribbon clip- neg 12/15/2013: BUNIONECTOMY; Right 11/18/2013: COLONOSCOPY     Comment:  Dr. Jeri No date: HALLUX VALGUS CORRECTION; Bilateral 11/17/2014: METATARSAL OSTEOTOMY; Left     Comment:  Procedure: METATARSAL OSTEOTOMY LEFT GREAT TOE 1ST               METATARSAL;  Surgeon: Donnice Cory, DPM;  Location:               [facility];  Service: Podiatry;  Laterality:               Left;  LMA WITH LOCAL Diabetic - oral meds No date: TONSILLECTOMY  BMI    Body Mass Index: 27.91 kg/m      Reproductive/Obstetrics negative OB ROS                              Anesthesia  Physical Anesthesia Plan  ASA: 2  Anesthesia Plan: General   Post-op Pain Management: Minimal or no pain anticipated   Induction: Intravenous  PONV Risk Score and Plan: Propofol  infusion and TIVA  Airway Management Planned: Natural Airway  Additional Equipment:   Intra-op Plan:   Post-operative Plan:   Informed Consent: I have reviewed the patients History and Physical, chart, labs and discussed the procedure including the risks, benefits and alternatives for the proposed anesthesia with the patient or authorized representative who has indicated his/her understanding and acceptance.     Dental Advisory Given  Plan Discussed with: CRNA and Surgeon  Anesthesia Plan Comments:          Anesthesia Quick Evaluation

## 2023-11-28 NOTE — Transfer of Care (Signed)
 Immediate Anesthesia Transfer of Care Note  Patient: Shelly Lee  Procedure(s) Performed: COLONOSCOPY  Patient Location: PACU  Anesthesia Type:General  Level of Consciousness: sedated  Airway & Oxygen Therapy: Patient Spontanous Breathing  Post-op Assessment: Report given to RN and Post -op Vital signs reviewed and stable  Post vital signs: Reviewed and stable  Last Vitals:  Vitals Value Taken Time  BP 97/61 11/28/23 10:10  Temp    Pulse 82 11/28/23 10:10  Resp 15 11/28/23 10:10  SpO2 99 % 11/28/23 10:10  Vitals shown include unfiled device data.  Last Pain:  Vitals:   11/28/23 1010  TempSrc:   PainSc: 0-No pain         Complications: No notable events documented.

## 2023-11-28 NOTE — H&P (Signed)
 Outpatient short stay form Pre-procedure 11/28/2023  Shelly ONEIDA Schick, MD  Primary Physician: Alla Amis, MD  Reason for visit:  Screening colonoscopy  History of present illness:    63 y/o lady with history of hypertension here for screening colonoscopy. Had colonoscopy 4 years ago with poor prep. Family history of polyps, likely large as one required surgery. No blood thinners. History of a hysterectomy.    Current Facility-Administered Medications:    0.9 %  sodium chloride infusion, , Intravenous, Continuous, Maribel Hadley, Shelly ONEIDA, MD, Last Rate: 20 mL/hr at 11/28/23 0920, Continued from Pre-op at 11/28/23 0920  Medications Prior to Admission  Medication Sig Dispense Refill Last Dose/Taking   amLODipine-benazepril (LOTREL) 10-20 MG capsule Take 1 capsule by mouth daily. AM   11/28/2023 at  6:00 AM   Multiple Vitamin (MULTIVITAMIN) tablet Take 1 tablet by mouth daily.   Past Week   Albiglutide (TANZEUM) 50 MG PEN Inject 50 mg into the skin once a week.   11/19/2023   aspirin 81 MG tablet Take 81 mg by mouth daily.      Canagliflozin-Metformin HCl (INVOKAMET) [phone] MG TABS Take 1 tablet by mouth 2 (two) times daily.   11/26/2023     Allergies  Allergen Reactions   Crestor [Rosuvastatin Calcium] Other (See Comments)    Hair fell out   Empagliflozin Other (See Comments)    BLURRED VISION     Past Medical History:  Diagnosis Date   Carpal tunnel syndrome    Diabetes mellitus without complication (HCC)    Hypercholesteremia    Hypertension    Primary osteoarthritis of left knee    Seasonal allergic rhinitis due to pollen    Subclinical hyperthyroidism    Wears dentures    partial upper    Review of systems:  Otherwise negative.    Physical Exam  Gen: Alert, oriented. Appears stated age.  HEENT: PERRLA. Lungs: No respiratory distress CV: RRR Abd: soft, benign, no masses Ext: No edema    Planned procedures: Proceed with colonoscopy. The patient  understands the nature of the planned procedure, indications, risks, alternatives and potential complications including but not limited to bleeding, infection, perforation, damage to internal organs and possible oversedation/side effects from anesthesia. The patient agrees and gives consent to proceed.  Please refer to procedure notes for findings, recommendations and patient disposition/instructions.     Shelly ONEIDA Schick, MD [facility]

## 2023-11-28 NOTE — Interval H&P Note (Signed)
 History and Physical Interval Note:  11/28/2023 9:44 AM  Shelly Lee  has presented today for surgery, with the diagnosis of Colon cancer screening (Z12.11) Family history of polyps in the colon (Z83.719).  The various methods of treatment have been discussed with the patient and family. After consideration of risks, benefits and other options for treatment, the patient has consented to  Procedure(s) with comments: COLONOSCOPY (N/A) - DM / Mounjaro as a surgical intervention.  The patient's history has been reviewed, patient examined, no change in status, stable for surgery.  I have reviewed the patient's chart and labs.  Questions were answered to the patient's satisfaction.     Shelly Lee  Ok to proceed with colonoscopy

## 2023-11-28 NOTE — Op Note (Signed)
 [facility] Gastroenterology Patient Name: Shelly Lee Procedure Date: 11/28/2023 9:38 AM MRN: [id number] Account #: [id number] Date of Birth: December 13, 1960 Admit Type: Outpatient Age: 63 Room: [facility] ENDO ROOM 3 Gender: Female Note Status: Finalized Instrument Name: Colon Scope [id number] Procedure:             Colonoscopy Indications:           Colon cancer screening in patient at increased risk:                         Family history of 1st-degree relative with colon polyps Providers:             Ole Schick MD, MD Referring MD:          Alda Carpen (Referring MD) Medicines:             Monitored Anesthesia Care Complications:         No immediate complications. Procedure:             Pre-Anesthesia Assessment:                        - Prior to the procedure, a History and Physical was                         performed, and patient medications and allergies were                         reviewed. The patient is competent. The risks and                         benefits of the procedure and the sedation options and                         risks were discussed with the patient. All questions                         were answered and informed consent was obtained.                         Patient identification and proposed procedure were                         verified by the physician, the nurse, the                         anesthesiologist, the anesthetist and the technician                         in the endoscopy suite. Mental Status Examination:                         alert and oriented. Airway Examination: normal                         oropharyngeal airway and neck mobility. Respiratory                         Examination: clear to auscultation. CV Examination:  normal. Prophylactic Antibiotics: The patient does not                         require prophylactic antibiotics. Prior                         Anticoagulants: The patient  has taken no anticoagulant                         or antiplatelet agents. ASA Grade Assessment: II - A                         patient with mild systemic disease. After reviewing                         the risks and benefits, the patient was deemed in                         satisfactory condition to undergo the procedure. The                         anesthesia plan was to use monitored anesthesia care                         (MAC). Immediately prior to administration of                         medications, the patient was re-assessed for adequacy                         to receive sedatives. The heart rate, respiratory                         rate, oxygen saturations, blood pressure, adequacy of                         pulmonary ventilation, and response to care were                         monitored throughout the procedure. The physical                         status of the patient was re-assessed after the                         procedure.                        After obtaining informed consent, the colonoscope was                         passed under direct vision. Throughout the procedure,                         the patient's blood pressure, pulse, and oxygen                         saturations were monitored continuously. The  Colonoscope was introduced through the anus and                         advanced to the the cecum, identified by appendiceal                         orifice and ileocecal valve. The colonoscopy was                         performed without difficulty. The patient tolerated                         the procedure well. The quality of the bowel                         preparation was adequate to identify polyps. The                         ileocecal valve, appendiceal orifice, and rectum were                         photographed. Findings:      The perianal and digital rectal examinations were normal.      Internal hemorrhoids were  found during retroflexion. The hemorrhoids       were Grade I (internal hemorrhoids that do not prolapse).      The exam was otherwise without abnormality on direct and retroflexion       views. Impression:            - Internal hemorrhoids.                        - The examination was otherwise normal on direct and                         retroflexion views.                        - No specimens collected. Recommendation:        - Discharge patient to home.                        - Resume previous diet.                        - Continue present medications.                        - Repeat colonoscopy in 5 years for screening purposes.                        - Return to referring physician as previously                         scheduled. Procedure Code(s):     --- Professional ---                        H9894, Colorectal cancer screening; colonoscopy on  individual at high risk Diagnosis Code(s):     --- Professional ---                        Z83.71, Family history of colonic polyps                        K64.0, First degree hemorrhoids CPT copyright 2022 [employer]. All rights reserved. The codes documented in this report are preliminary and upon coder review may  be revised to meet current compliance requirements. Ole Schick MD, MD 11/28/2023 10:10:29 AM Number of Addenda: 0 Note Initiated On: 11/28/2023 9:38 AM Scope Withdrawal Time: 0 hours 8 minutes 27 seconds  Total Procedure Duration: 0 hours 14 minutes 51 seconds  Estimated Blood Loss:  Estimated blood loss: none.      [facility]

## 2023-12-01 ENCOUNTER — Encounter: Payer: Self-pay | Admitting: Gastroenterology
